# Patient Record
Sex: Male | Born: 1940 | Race: White | Hispanic: No | Marital: Married | Smoking: Never smoker
Health system: Southern US, Community
[De-identification: ages and names within clinical notes are randomized; demographics above are authoritative.]

## PROBLEM LIST (undated history)

## (undated) DIAGNOSIS — I10 Essential (primary) hypertension: Secondary | ICD-10-CM

## (undated) DIAGNOSIS — E079 Disorder of thyroid, unspecified: Secondary | ICD-10-CM

## (undated) DIAGNOSIS — E119 Type 2 diabetes mellitus without complications: Secondary | ICD-10-CM

## (undated) DIAGNOSIS — Z9889 Other specified postprocedural states: Secondary | ICD-10-CM

## (undated) DIAGNOSIS — I4891 Unspecified atrial fibrillation: Secondary | ICD-10-CM

## (undated) DIAGNOSIS — E789 Disorder of lipoprotein metabolism, unspecified: Secondary | ICD-10-CM

## (undated) HISTORY — PX: THYROIDECTOMY: SHX17

## (undated) HISTORY — PX: CHOLECYSTECTOMY: SHX55

## (undated) HISTORY — PX: KNEE SURGERY: SHX244

## (undated) HISTORY — PX: BACK SURGERY: SHX140

---

## 2000-10-05 ENCOUNTER — Encounter: Payer: Self-pay | Admitting: Orthopedic Surgery

## 2000-10-05 ENCOUNTER — Encounter: Admission: RE | Admit: 2000-10-05 | Discharge: 2000-10-05 | Payer: Self-pay | Admitting: Orthopedic Surgery

## 2005-09-25 ENCOUNTER — Encounter: Admission: RE | Admit: 2005-09-25 | Discharge: 2005-09-25 | Payer: Self-pay | Admitting: Orthopedic Surgery

## 2005-11-04 ENCOUNTER — Encounter: Admission: RE | Admit: 2005-11-04 | Discharge: 2005-11-04 | Payer: Self-pay | Admitting: Chiropractor

## 2007-08-04 ENCOUNTER — Encounter: Admission: RE | Admit: 2007-08-04 | Discharge: 2007-08-04 | Payer: Self-pay | Admitting: Obstetrics and Gynecology

## 2009-02-13 ENCOUNTER — Encounter: Admission: RE | Admit: 2009-02-13 | Discharge: 2009-02-13 | Payer: Self-pay | Admitting: Obstetrics and Gynecology

## 2009-04-06 ENCOUNTER — Inpatient Hospital Stay (HOSPITAL_COMMUNITY): Admission: RE | Admit: 2009-04-06 | Discharge: 2009-04-06 | Payer: Self-pay | Admitting: Neurosurgery

## 2009-05-11 ENCOUNTER — Encounter: Admission: RE | Admit: 2009-05-11 | Discharge: 2009-05-11 | Payer: Self-pay | Admitting: Neurosurgery

## 2009-09-30 ENCOUNTER — Emergency Department (HOSPITAL_BASED_OUTPATIENT_CLINIC_OR_DEPARTMENT_OTHER): Admission: EM | Admit: 2009-09-30 | Discharge: 2009-09-30 | Payer: Self-pay | Admitting: Emergency Medicine

## 2010-03-10 ENCOUNTER — Encounter: Payer: Self-pay | Admitting: Neurosurgery

## 2010-05-03 LAB — URINALYSIS, ROUTINE W REFLEX MICROSCOPIC
Bilirubin Urine: NEGATIVE
Glucose, UA: NEGATIVE mg/dL
Hgb urine dipstick: NEGATIVE
Protein, ur: NEGATIVE mg/dL
Urobilinogen, UA: 0.2 mg/dL (ref 0.0–1.0)

## 2010-05-09 LAB — BASIC METABOLIC PANEL
BUN: 20 mg/dL (ref 6–23)
CO2: 26 mEq/L (ref 19–32)
Calcium: 9.1 mg/dL (ref 8.4–10.5)
Glucose, Bld: 181 mg/dL — ABNORMAL HIGH (ref 70–99)
Sodium: 136 mEq/L (ref 135–145)

## 2010-05-09 LAB — GLUCOSE, CAPILLARY

## 2010-05-09 LAB — CBC
Hemoglobin: 14 g/dL (ref 13.0–17.0)
MCHC: 34.2 g/dL (ref 30.0–36.0)
MCV: 94.7 fL (ref 78.0–100.0)
RBC: 4.34 MIL/uL (ref 4.22–5.81)
RDW: 14.6 % (ref 11.5–15.5)

## 2010-05-09 LAB — DIFFERENTIAL
Basophils Absolute: 0.1 10*3/uL (ref 0.0–0.1)
Basophils Relative: 1 % (ref 0–1)
Eosinophils Absolute: 0.2 10*3/uL (ref 0.0–0.7)
Monocytes Absolute: 0.7 10*3/uL (ref 0.1–1.0)
Monocytes Relative: 12 % (ref 3–12)
Neutrophils Relative %: 58 % (ref 43–77)

## 2010-05-09 LAB — POCT I-STAT 4, (NA,K, GLUC, HGB,HCT)
Glucose, Bld: 166 mg/dL — ABNORMAL HIGH (ref 70–99)
Potassium: 5.1 mEq/L (ref 3.5–5.1)
Sodium: 145 mEq/L (ref 135–145)

## 2010-05-22 ENCOUNTER — Other Ambulatory Visit: Payer: Self-pay | Admitting: Neurosurgery

## 2010-05-22 DIAGNOSIS — M545 Low back pain, unspecified: Secondary | ICD-10-CM

## 2010-05-28 ENCOUNTER — Ambulatory Visit
Admission: RE | Admit: 2010-05-28 | Discharge: 2010-05-28 | Disposition: A | Discharge status: Home / Self Care | Payer: Medicare Other | Source: Ambulatory Visit | Attending: Neurosurgery | Admitting: Neurosurgery

## 2010-05-28 DIAGNOSIS — M545 Low back pain, unspecified: Secondary | ICD-10-CM

## 2010-07-19 ENCOUNTER — Other Ambulatory Visit: Payer: Self-pay | Admitting: *Deleted

## 2010-07-19 DIAGNOSIS — M25562 Pain in left knee: Secondary | ICD-10-CM

## 2010-07-22 ENCOUNTER — Ambulatory Visit
Admission: RE | Admit: 2010-07-22 | Discharge: 2010-07-22 | Disposition: A | Discharge status: Home / Self Care | Payer: Medicare Other | Source: Ambulatory Visit | Attending: *Deleted | Admitting: *Deleted

## 2010-07-22 DIAGNOSIS — M25562 Pain in left knee: Secondary | ICD-10-CM

## 2011-07-19 DEATH — deceased

## 2011-07-25 ENCOUNTER — Other Ambulatory Visit: Payer: Self-pay | Admitting: Neurosurgery

## 2011-07-25 DIAGNOSIS — M545 Low back pain, unspecified: Secondary | ICD-10-CM

## 2011-07-25 DIAGNOSIS — M542 Cervicalgia: Secondary | ICD-10-CM

## 2011-08-02 ENCOUNTER — Ambulatory Visit
Admission: RE | Admit: 2011-08-02 | Discharge: 2011-08-02 | Disposition: A | Discharge status: Home / Self Care | Payer: Medicare Other | Source: Ambulatory Visit | Attending: Neurosurgery | Admitting: Neurosurgery

## 2011-08-02 DIAGNOSIS — M545 Low back pain, unspecified: Secondary | ICD-10-CM

## 2011-08-02 DIAGNOSIS — M542 Cervicalgia: Secondary | ICD-10-CM

## 2011-09-18 ENCOUNTER — Other Ambulatory Visit: Payer: Self-pay | Admitting: Neurosurgery

## 2011-09-18 DIAGNOSIS — M549 Dorsalgia, unspecified: Secondary | ICD-10-CM

## 2011-09-21 ENCOUNTER — Ambulatory Visit
Admission: RE | Admit: 2011-09-21 | Discharge: 2011-09-21 | Disposition: A | Discharge status: Home / Self Care | Payer: Medicare Other | Source: Ambulatory Visit | Attending: Neurosurgery | Admitting: Neurosurgery

## 2011-09-21 DIAGNOSIS — M549 Dorsalgia, unspecified: Secondary | ICD-10-CM

## 2012-01-30 ENCOUNTER — Other Ambulatory Visit: Payer: Self-pay | Admitting: Orthopedic Surgery

## 2012-01-30 DIAGNOSIS — M25561 Pain in right knee: Secondary | ICD-10-CM

## 2012-01-30 DIAGNOSIS — M25562 Pain in left knee: Secondary | ICD-10-CM

## 2012-02-05 ENCOUNTER — Other Ambulatory Visit: Payer: Medicare Other

## 2012-02-06 ENCOUNTER — Other Ambulatory Visit: Payer: Medicare Other

## 2012-02-07 ENCOUNTER — Ambulatory Visit
Admission: RE | Admit: 2012-02-07 | Discharge: 2012-02-07 | Disposition: A | Discharge status: Home / Self Care | Payer: Medicare Other | Source: Ambulatory Visit | Attending: Orthopedic Surgery | Admitting: Orthopedic Surgery

## 2012-02-07 DIAGNOSIS — M25562 Pain in left knee: Secondary | ICD-10-CM

## 2012-02-07 DIAGNOSIS — M25561 Pain in right knee: Secondary | ICD-10-CM

## 2012-11-22 ENCOUNTER — Other Ambulatory Visit: Payer: Self-pay | Admitting: Neurosurgery

## 2012-11-22 DIAGNOSIS — M545 Low back pain, unspecified: Secondary | ICD-10-CM

## 2012-12-03 ENCOUNTER — Other Ambulatory Visit: Payer: Medicare Other

## 2013-03-18 ENCOUNTER — Other Ambulatory Visit: Payer: Self-pay | Admitting: Orthopedic Surgery

## 2013-03-18 DIAGNOSIS — M5030 Other cervical disc degeneration, unspecified cervical region: Secondary | ICD-10-CM

## 2013-03-21 ENCOUNTER — Ambulatory Visit
Admission: RE | Admit: 2013-03-21 | Discharge: 2013-03-21 | Disposition: A | Discharge status: Home / Self Care | Payer: Medicare Other | Source: Ambulatory Visit | Attending: Orthopedic Surgery | Admitting: Orthopedic Surgery

## 2013-03-21 DIAGNOSIS — M5030 Other cervical disc degeneration, unspecified cervical region: Secondary | ICD-10-CM

## 2013-05-09 ENCOUNTER — Other Ambulatory Visit: Payer: Self-pay | Admitting: Physical Medicine and Rehabilitation

## 2013-05-09 DIAGNOSIS — M545 Low back pain, unspecified: Secondary | ICD-10-CM

## 2013-05-16 ENCOUNTER — Ambulatory Visit
Admission: RE | Admit: 2013-05-16 | Discharge: 2013-05-16 | Disposition: A | Discharge status: Home / Self Care | Payer: Medicare Other | Source: Ambulatory Visit | Attending: Physical Medicine and Rehabilitation | Admitting: Physical Medicine and Rehabilitation

## 2013-05-16 DIAGNOSIS — M545 Low back pain, unspecified: Secondary | ICD-10-CM

## 2013-05-16 MED ORDER — GADOBENATE DIMEGLUMINE 529 MG/ML IV SOLN
20.0000 mL | Freq: Once | INTRAVENOUS | Status: AC | PRN
  Administered 2013-05-16: 20 mL via INTRAVENOUS

## 2013-05-25 ENCOUNTER — Other Ambulatory Visit: Payer: Self-pay | Admitting: Family Medicine

## 2013-05-25 DIAGNOSIS — N2889 Other specified disorders of kidney and ureter: Secondary | ICD-10-CM

## 2013-05-25 DIAGNOSIS — R911 Solitary pulmonary nodule: Secondary | ICD-10-CM

## 2013-06-01 ENCOUNTER — Other Ambulatory Visit: Payer: Medicare Other

## 2013-06-01 ENCOUNTER — Inpatient Hospital Stay: Admission: RE | Admit: 2013-06-01 | Payer: Medicare Other | Source: Ambulatory Visit

## 2013-06-07 ENCOUNTER — Emergency Department (HOSPITAL_BASED_OUTPATIENT_CLINIC_OR_DEPARTMENT_OTHER): Payer: Medicare Other

## 2013-06-07 ENCOUNTER — Encounter (HOSPITAL_BASED_OUTPATIENT_CLINIC_OR_DEPARTMENT_OTHER): Payer: Self-pay | Admitting: Emergency Medicine

## 2013-06-07 ENCOUNTER — Emergency Department (HOSPITAL_BASED_OUTPATIENT_CLINIC_OR_DEPARTMENT_OTHER)
Admission: EM | Admit: 2013-06-07 | Discharge: 2013-06-07 | Disposition: A | Discharge status: Home / Self Care | Payer: Medicare Other | Attending: Emergency Medicine | Admitting: Emergency Medicine

## 2013-06-07 DIAGNOSIS — S8010XA Contusion of unspecified lower leg, initial encounter: Secondary | ICD-10-CM | POA: Insufficient documentation

## 2013-06-07 DIAGNOSIS — I4891 Unspecified atrial fibrillation: Secondary | ICD-10-CM | POA: Insufficient documentation

## 2013-06-07 DIAGNOSIS — S9030XA Contusion of unspecified foot, initial encounter: Secondary | ICD-10-CM | POA: Insufficient documentation

## 2013-06-07 DIAGNOSIS — S8011XA Contusion of right lower leg, initial encounter: Secondary | ICD-10-CM

## 2013-06-07 DIAGNOSIS — Y939 Activity, unspecified: Secondary | ICD-10-CM | POA: Insufficient documentation

## 2013-06-07 DIAGNOSIS — W010XXA Fall on same level from slipping, tripping and stumbling without subsequent striking against object, initial encounter: Secondary | ICD-10-CM | POA: Insufficient documentation

## 2013-06-07 DIAGNOSIS — Y929 Unspecified place or not applicable: Secondary | ICD-10-CM | POA: Insufficient documentation

## 2013-06-07 DIAGNOSIS — Z7982 Long term (current) use of aspirin: Secondary | ICD-10-CM | POA: Insufficient documentation

## 2013-06-07 DIAGNOSIS — Z79899 Other long term (current) drug therapy: Secondary | ICD-10-CM | POA: Insufficient documentation

## 2013-06-07 HISTORY — DX: Other specified postprocedural states: Z98.890

## 2013-06-07 HISTORY — DX: Unspecified atrial fibrillation: I48.91

## 2013-06-07 NOTE — ED Notes (Addendum)
Tripped/fell 2 weeks ago at home-c/o pain to right foot/ankle-pt is wearing postop boot-steady gait into traige

## 2013-06-07 NOTE — Discharge Instructions (Signed)
Foot Contusion °A foot contusion is a deep bruise to the foot. Contusions are the result of an injury that caused bleeding under the skin. The contusion may turn blue, purple, or yellow. Minor injuries will give you a painless contusion, but more severe contusions may stay painful and swollen for a few weeks. °CAUSES  °A foot contusion comes from a direct blow to that area, such as a heavy object falling on the foot. °SYMPTOMS  °· Swelling of the foot. °· Discoloration of the foot. °· Tenderness or soreness of the foot. °DIAGNOSIS  °You will have a physical exam and will be asked about your history. You may need an X-Mairead Schwarzkopf of your foot to look for a broken bone (fracture).  °TREATMENT  °An elastic wrap may be recommended to support your foot. Resting, elevating, and applying cold compresses to your foot are often the best treatments for a foot contusion. Over-the-counter medicines may also be recommended for pain control. °HOME CARE INSTRUCTIONS  °· Put ice on the injured area. °· Put ice in a plastic bag. °· Place a towel between your skin and the bag. °· Leave the ice on for 15-20 minutes, 03-04 times a day. °· Only take over-the-counter or prescription medicines for pain, discomfort, or fever as directed by your caregiver. °· If told, use an elastic wrap as directed. This can help reduce swelling. You may remove the wrap for sleeping, showering, and bathing. If your toes become numb, cold, or blue, take the wrap off and reapply it more loosely. °· Elevate your foot with pillows to reduce swelling. °· Try to avoid standing or walking while the foot is painful. Do not resume use until instructed by your caregiver. Then, begin use gradually. If pain develops, decrease use. Gradually increase activities that do not cause discomfort until you have normal use of your foot. °· See your caregiver as directed. It is very important to keep all follow-up appointments in order to avoid any lasting problems with your foot,  including long-term (chronic) pain. °SEEK IMMEDIATE MEDICAL CARE IF:  °· You have increased redness, swelling, or pain in your foot. °· Your swelling or pain is not relieved with medicines. °· You have loss of feeling in your foot or are unable to move your toes. °· Your foot turns cold or blue. °· You have pain when you move your toes. °· Your foot becomes warm to the touch. °· Your contusion does not improve in 2 days. °MAKE SURE YOU:  °· Understand these instructions. °· Will watch your condition. °· Will get help right away if you are not doing well or get worse. °Document Released: 11/25/2005 Document Revised: 08/05/2011 Document Reviewed: 01/07/2011 °[employer]® Patient Information ©2014 [employer]. ° °

## 2013-06-07 NOTE — ED Provider Notes (Signed)
CSN: [id number]     Arrival date & time 06/07/13  1443 History   First MD Initiated Contact with Patient 06/07/13 1516     Chief Complaint  Patient presents with  . Foot Injury     (Consider location/radiation/quality/duration/timing/severity/associated sxs/prior Treatment) Patient is a 73 y.o. male presenting with foot injury. The history is provided by the patient.  Foot Injury Location:  Foot and leg Time since incident:  2 weeks Injury: yes   Mechanism of injury: fall   Mechanism of injury comment:  Patient tripped striking right leg against chair and fell injuring right foot.  Fall:    Fall occurred:  Standing   Impact surface:  Hard floor   Point of impact:  Knees   Entrapped after fall: no   Leg location:  R leg Foot location:  R foot Pain details:    Quality:  Aching   Severity:  Moderate   Onset quality:  Sudden   Timing:  Constant Chronicity:  New Dislocation: no   Foreign body present:  No foreign bodies Relieved by:  Nothing   Past Medical History  Diagnosis Date  . A-fib   . History of cardiac radiofrequency ablation    Past Surgical History  Procedure Laterality Date  . Back surgery    . Thyroidectomy    . Cholecystectomy     No family history on file. History  Substance Use Topics  . Smoking status: Never Smoker   . Smokeless tobacco: Not on file  . Alcohol Use: No    Review of Systems    Allergies  Review of patient's allergies indicates no known allergies.  Home Medications   Prior to Admission medications   Medication Sig Start Date End Date Taking? Authorizing Provider  aspirin 325 MG tablet Take 325 mg by mouth daily.   Yes Historical Provider, MD  atorvastatin (LIPITOR) 20 MG tablet Take 20 mg by mouth daily.   Yes Historical Provider, MD  FLECAINIDE ACETATE PO Take by mouth.   Yes Historical Provider, MD  GLIMEPIRIDE PO Take by mouth.   Yes Historical Provider, MD  Levothyroxine Sodium (SYNTHROID PO) Take by mouth.   Yes  Historical Provider, MD  LOSARTAN POTASSIUM PO Take by mouth.   Yes Historical Provider, MD  METFORMIN HCL PO Take by mouth.   Yes Historical Provider, MD  OMEPRAZOLE PO Take by mouth.   Yes Historical Provider, MD  tamsulosin (FLOMAX) 0.4 MG CAPS capsule Take 0.4 mg by mouth.   Yes Historical Provider, MD  TRIAMTERENE PO Take by mouth.   Yes Historical Provider, MD  UNKNOWN TO PATIENT "insulin pen"-15 units daily-pt unsure of insulin name   Yes Historical Provider, MD   BP 149/89  Pulse 104  Temp(Src) 97.7 F (36.5 C) (Oral)  Resp 18  Ht 6\' 1"  (1.854 m)  Wt 300 lb (136.079 kg)  BMI 39.59 kg/m2  SpO2 100% Physical Exam  Nursing note and vitals reviewed. Constitutional: He is oriented to person, place, and time. He appears well-developed and well-nourished.  Morbidly obese  HENT:  Head: Normocephalic and atraumatic.  Eyes: Conjunctivae are normal. Pupils are equal, round, and reactive to light.  Neck: Normal range of motion.  Pulmonary/Chest: Effort normal.  Abdominal: Soft.  Musculoskeletal: Normal range of motion. He exhibits edema and tenderness.       Legs:      Feet:  Swelling   Neurological: He is alert and oriented to person, place, and time. He has normal reflexes.  Skin: Skin is warm and dry.  Psychiatric: He has a normal mood and affect. His behavior is normal. Judgment and thought content normal.    ED Course  Procedures (including critical care time) Labs Review Labs Reviewed - No data to display  Imaging Review No results found.   EKG Interpretation None      MDM   Final diagnoses:  None    Patient with no evidence of fracture.  Plan op referral to ortho for follow up.     Hilario Quarryanielle S Tramaine Sauls, MD 06/07/13 [phone]

## 2013-06-08 ENCOUNTER — Other Ambulatory Visit: Payer: Medicare Other

## 2014-06-15 ENCOUNTER — Other Ambulatory Visit: Payer: Self-pay | Admitting: Family Medicine

## 2014-06-16 ENCOUNTER — Other Ambulatory Visit: Payer: Self-pay | Admitting: Obstetrics and Gynecology

## 2014-06-16 DIAGNOSIS — R1031 Right lower quadrant pain: Secondary | ICD-10-CM

## 2014-07-06 ENCOUNTER — Ambulatory Visit
Admission: RE | Admit: 2014-07-06 | Discharge: 2014-07-06 | Disposition: A | Discharge status: Home / Self Care | Payer: Medicare Other | Source: Ambulatory Visit | Attending: Obstetrics and Gynecology | Admitting: Obstetrics and Gynecology

## 2014-07-06 DIAGNOSIS — R1031 Right lower quadrant pain: Secondary | ICD-10-CM

## 2014-09-01 ENCOUNTER — Emergency Department (HOSPITAL_BASED_OUTPATIENT_CLINIC_OR_DEPARTMENT_OTHER): Payer: Medicare Other

## 2014-09-01 ENCOUNTER — Emergency Department (HOSPITAL_BASED_OUTPATIENT_CLINIC_OR_DEPARTMENT_OTHER)
Admission: EM | Admit: 2014-09-01 | Discharge: 2014-09-01 | Disposition: A | Discharge status: Home / Self Care | Payer: Medicare Other | Attending: Emergency Medicine | Admitting: Emergency Medicine

## 2014-09-01 ENCOUNTER — Encounter (HOSPITAL_BASED_OUTPATIENT_CLINIC_OR_DEPARTMENT_OTHER): Payer: Self-pay | Admitting: *Deleted

## 2014-09-01 DIAGNOSIS — I4891 Unspecified atrial fibrillation: Secondary | ICD-10-CM | POA: Diagnosis not present

## 2014-09-01 DIAGNOSIS — Z7982 Long term (current) use of aspirin: Secondary | ICD-10-CM | POA: Insufficient documentation

## 2014-09-01 DIAGNOSIS — Z9889 Other specified postprocedural states: Secondary | ICD-10-CM | POA: Diagnosis not present

## 2014-09-01 DIAGNOSIS — M549 Dorsalgia, unspecified: Secondary | ICD-10-CM | POA: Diagnosis present

## 2014-09-01 DIAGNOSIS — R079 Chest pain, unspecified: Secondary | ICD-10-CM | POA: Diagnosis not present

## 2014-09-01 DIAGNOSIS — Z794 Long term (current) use of insulin: Secondary | ICD-10-CM | POA: Insufficient documentation

## 2014-09-01 NOTE — ED Notes (Signed)
Patient transported to X-ray 

## 2014-09-01 NOTE — Discharge Instructions (Signed)

## 2014-09-01 NOTE — ED Notes (Signed)
For approx 1 month, having pain at rt upper back pain with radiation to rt chest. States has been seen by PCP

## 2014-09-01 NOTE — ED Provider Notes (Signed)
CSN: [id number]     Arrival date & time 09/01/14  1327 History   First MD Initiated Contact with Patient 09/01/14 1343     Chief Complaint  Patient presents with  . Back Pain     (Consider location/radiation/quality/duration/timing/severity/associated sxs/prior Treatment) Patient is a 74 y.o. male presenting with back pain.  Back Pain Pain location: upper right back pain. Quality:  Stabbing and aching Radiates to: right chest. Pain severity now: currently 6/10, has been 10 prior. Duration:  6 weeks Timing:  Constant Relieved by: hydrocodone, heat, massage. Worsened by:  Movement (moving arm) Ineffective treatments: not exertional. Associated symptoms: chest pain   Associated symptoms: no abdominal pain, no bladder incontinence, no bowel incontinence, no fever, no headaches, no numbness, no tingling and no weakness   Risk factors: no hx of cancer (hashimoto's thyroiditis)     Past Medical History  Diagnosis Date  . A-fib   . History of cardiac radiofrequency ablation    Past Surgical History  Procedure Laterality Date  . Back surgery    . Thyroidectomy    . Cholecystectomy     No family history on file. History  Substance Use Topics  . Smoking status: Never Smoker   . Smokeless tobacco: Not on file  . Alcohol Use: No    Review of Systems  Constitutional: Negative for fever.  HENT: Negative for sore throat.   Eyes: Negative for visual disturbance.  Respiratory: Negative for shortness of breath.   Cardiovascular: Positive for chest pain.  Gastrointestinal: Negative for nausea, vomiting, abdominal pain and bowel incontinence.  Genitourinary: Negative for bladder incontinence and difficulty urinating.  Musculoskeletal: Positive for back pain. Negative for neck stiffness.  Skin: Negative for rash.  Neurological: Negative for tingling, syncope, weakness, numbness and headaches.      Allergies  Review of patient's allergies indicates no known allergies.  Home  Medications   Prior to Admission medications   Medication Sig Start Date End Date Taking? Authorizing Provider  Apixaban (ELIQUIS PO) Take by mouth.   Yes Historical Provider, MD  aspirin 325 MG tablet Take 325 mg by mouth daily.    Historical Provider, MD  atorvastatin (LIPITOR) 20 MG tablet Take 20 mg by mouth daily.    Historical Provider, MD  FLECAINIDE ACETATE PO Take by mouth.    Historical Provider, MD  GLIMEPIRIDE PO Take by mouth.    Historical Provider, MD  Levothyroxine Sodium (SYNTHROID PO) Take by mouth.    Historical Provider, MD  LOSARTAN POTASSIUM PO Take by mouth.    Historical Provider, MD  METFORMIN HCL PO Take by mouth.    Historical Provider, MD  OMEPRAZOLE PO Take by mouth.    Historical Provider, MD  tamsulosin (FLOMAX) 0.4 MG CAPS capsule Take 0.4 mg by mouth.    Historical Provider, MD  TRIAMTERENE PO Take by mouth.    Historical Provider, MD  UNKNOWN TO PATIENT "insulin pen"-15 units daily-pt unsure of insulin name    Historical Provider, MD   BP 125/77 mmHg  Pulse 94  Temp(Src) 97.8 F (36.6 C) (Oral)  Resp 18  Ht  (1.854 m)  Wt 300 lb (136.079 kg)  BMI 39.59 kg/m2  SpO2 95% Physical Exam  Constitutional: He is oriented to person, place, and time. He appears well-developed and well-nourished. No distress.  HENT:  Head: Normocephalic and atraumatic.  Eyes: Conjunctivae and EOM are normal.  Neck: Normal range of motion.  Cardiovascular: Normal rate, regular rhythm, normal heart sounds and intact  distal pulses.  Exam reveals no gallop and no friction rub.   No murmur heard. Pulmonary/Chest: Effort normal and breath sounds normal. No respiratory distress. He has no wheezes. He has no rales. He exhibits tenderness (right chest).  Abdominal: Soft. He exhibits no distension. There is no tenderness. There is no guarding.  Musculoskeletal: He exhibits tenderness (right back). He exhibits no edema.  Neurological: He is alert and oriented to person, place,  and time.  Skin: Skin is warm and dry. He is not diaphoretic.  Nursing note and vitals reviewed.   ED Course  Procedures (including critical care time) Labs Review Labs Reviewed - No data to display  Imaging Review Dg Chest 2 View  09/01/2014   CLINICAL DATA:  Right-sided chest pain for 6 weeks.  EXAM: CHEST  2 VIEW  COMPARISON:  04/02/2009  FINDINGS: The heart size and mediastinal contours are within normal limits. Both lungs are clear. No evidence of pneumothorax or pleural effusion. Thoracic spine degenerative changes again noted.  IMPRESSION: No active cardiopulmonary disease.   Electronically Signed   By: Myles RosenthalJohn  Stahl M.D.   On: 09/01/2014 14:43     EKG Interpretation None      ED ECG REPORT   Date: 09/01/2014  Rate: 104  Rhythm: indeterminate, possible junctional rhythm  QRS Axis: normal  Intervals: normal  ST/T Wave abnormalities: normal  Conduction Disutrbances:left anterior fascicular block  Narrative Interpretation:   Old EKG Reviewed: changes noted, unclear p waves on current EKG, possible junctional rhythm  I have personally reviewed the EKG tracing and agree with the computerized printout as noted.   MDM   Final diagnoses:  Upper back pain on right side, likely musculoskeletal on exam   74yo male with history of atrial fibrillation on eliquis presents with concern of right sided back pain radiating to the right chest for 6 wks.  Patient has seen PCP for same and has had some improvement with hydrocodone.  History of pain worse with arm movements and palpation for 6wk most consistent with muscular strain.  Screening EKG done with unclear pw, however regular rhythm, no significant change in QRS/ST changes. Doubt ACS, PE, dissection given hx and exam.  No dyspnea, asymmetric leg swelling, hx of PE/DVT.  CXR done and radiology report and images reviewed by me showing no acute abnormalities. Patient has a normal neurologic exam and denies any urinary retention or overflow  incontinence, stool incontinence, saddle anesthesia, fever, IV drug use, trauma, chronic steroid use or immunocompromise and have low suspicion suspicion for cauda equina, fracture, epidural abscess, or vertebral osteomyelitis.   Discussed likely musculoskeletal pain, however given duration of symptoms and family's concern for other etiologies encouraged close follow up with PCP. Patient discharged in stable condition with understanding of reasons to return.     Alvira MondayErin Shaunee Mulkern, MD 09/02/14 1155

## 2014-09-01 NOTE — ED Notes (Signed)
Pain in his right scapula for over a month. Feels muscular. His MD has given him Vicodin for the pain with temporary relief but no cause for the pain.

## 2014-09-11 ENCOUNTER — Ambulatory Visit
Admission: RE | Admit: 2014-09-11 | Discharge: 2014-09-11 | Disposition: A | Discharge status: Home / Self Care | Payer: Medicare Other | Source: Ambulatory Visit | Attending: Family Medicine | Admitting: Family Medicine

## 2014-09-11 ENCOUNTER — Other Ambulatory Visit: Payer: Self-pay | Admitting: Family Medicine

## 2014-09-11 DIAGNOSIS — M47812 Spondylosis without myelopathy or radiculopathy, cervical region: Secondary | ICD-10-CM

## 2015-09-12 ENCOUNTER — Ambulatory Visit (HOSPITAL_COMMUNITY)
Admission: EM | Admit: 2015-09-12 | Discharge: 2015-09-12 | Disposition: A | Discharge status: Home / Self Care | Payer: Medicare Other

## 2018-10-17 ENCOUNTER — Emergency Department (HOSPITAL_BASED_OUTPATIENT_CLINIC_OR_DEPARTMENT_OTHER)
Admission: EM | Admit: 2018-10-17 | Discharge: 2018-10-17 | Disposition: A | Discharge status: Home / Self Care | Payer: Medicare Other | Attending: Emergency Medicine | Admitting: Emergency Medicine

## 2018-10-17 ENCOUNTER — Other Ambulatory Visit: Payer: Self-pay

## 2018-10-17 ENCOUNTER — Encounter (HOSPITAL_BASED_OUTPATIENT_CLINIC_OR_DEPARTMENT_OTHER): Payer: Self-pay | Admitting: Emergency Medicine

## 2018-10-17 ENCOUNTER — Emergency Department (HOSPITAL_BASED_OUTPATIENT_CLINIC_OR_DEPARTMENT_OTHER): Payer: Medicare Other

## 2018-10-17 DIAGNOSIS — S8992XA Unspecified injury of left lower leg, initial encounter: Secondary | ICD-10-CM | POA: Diagnosis present

## 2018-10-17 DIAGNOSIS — Y929 Unspecified place or not applicable: Secondary | ICD-10-CM | POA: Diagnosis not present

## 2018-10-17 DIAGNOSIS — S8002XA Contusion of left knee, initial encounter: Secondary | ICD-10-CM | POA: Diagnosis not present

## 2018-10-17 DIAGNOSIS — Y999 Unspecified external cause status: Secondary | ICD-10-CM | POA: Diagnosis not present

## 2018-10-17 DIAGNOSIS — Z79899 Other long term (current) drug therapy: Secondary | ICD-10-CM | POA: Diagnosis not present

## 2018-10-17 DIAGNOSIS — Z96652 Presence of left artificial knee joint: Secondary | ICD-10-CM | POA: Diagnosis not present

## 2018-10-17 DIAGNOSIS — Y9301 Activity, walking, marching and hiking: Secondary | ICD-10-CM | POA: Insufficient documentation

## 2018-10-17 DIAGNOSIS — Z794 Long term (current) use of insulin: Secondary | ICD-10-CM | POA: Diagnosis not present

## 2018-10-17 DIAGNOSIS — E119 Type 2 diabetes mellitus without complications: Secondary | ICD-10-CM | POA: Diagnosis not present

## 2018-10-17 DIAGNOSIS — W010XXA Fall on same level from slipping, tripping and stumbling without subsequent striking against object, initial encounter: Secondary | ICD-10-CM | POA: Insufficient documentation

## 2018-10-17 DIAGNOSIS — I10 Essential (primary) hypertension: Secondary | ICD-10-CM | POA: Diagnosis not present

## 2018-10-17 DIAGNOSIS — Z7901 Long term (current) use of anticoagulants: Secondary | ICD-10-CM | POA: Diagnosis not present

## 2018-10-17 HISTORY — DX: Disorder of lipoprotein metabolism, unspecified: E78.9

## 2018-10-17 HISTORY — DX: Type 2 diabetes mellitus without complications: E11.9

## 2018-10-17 HISTORY — DX: Essential (primary) hypertension: I10

## 2018-10-17 HISTORY — DX: Disorder of thyroid, unspecified: E07.9

## 2018-10-17 NOTE — ED Provider Notes (Signed)
[facility] EMERGENCY DEPARTMENT Provider Note   CSN: [id number] Arrival date & time: 10/17/18  1400     History   Chief Complaint Chief Complaint  Patient presents with  . Fall  . Tachycardia    HPI Rodney Murray is a 78 y.o. male.     Patient is a 78 year old male with a history of diabetes, hypertension, paroxysmal atrial fibrillation on Eliquis who presents today due to persistent swelling of his left knee.  Patient states that 1 week ago he was walking and tripped over his cane and landed directly on his left knee.  It immediately swelled and became painful.  He has been elevating the leg, icing it and states that in general the pain and swelling are improving but there is still significant swelling over the left knee.  He has been able to ambulate with minimal difficulty and denies any drainage from the area.  He denies hitting his head or loss of consciousness.  He states he otherwise feels fine.  Nurse did note that upon arrival his heart rate was in the 140s however when they rechecked it was 60.  He states he does intermittently go in and out of atrial fibrillation but denies any chest pain or shortness of breath or palpitations at this time.  The history is provided by the patient.  Fall    Past Medical History:  Diagnosis Date  . A-fib (HCC)   . Abnormal serum cholesterol   . Diabetes mellitus without complication (HCC)   . History of cardiac radiofrequency ablation   . Hypertension   . Thyroid disease     There are no active problems to display for this patient.   Past Surgical History:  Procedure Laterality Date  . BACK SURGERY    . CHOLECYSTECTOMY    . THYROIDECTOMY          Home Medications    Prior to Admission medications   Medication Sig Start Date End Date Taking? Authorizing Provider  Apixaban (ELIQUIS PO) Take by mouth.   Yes [provider]  atorvastatin (LIPITOR) 20 MG tablet Take 20 mg by mouth daily.   Yes [provider]  dronedarone (MULTAQ) 400 MG tablet Take 400 mg by mouth 2 (two) times daily with a meal.   Yes [provider]  GLIMEPIRIDE PO Take by mouth.   Yes [provider]  Insulin Detemir (LEVEMIR FLEXTOUCH) 100 UNIT/ML Pen Inject 36 Units into the skin at bedtime.  01/07/13  Yes [provider]  Levothyroxine Sodium (SYNTHROID PO) Take by mouth.   Yes [provider]  LOSARTAN POTASSIUM PO Take by mouth.   Yes [provider]  METFORMIN HCL PO Take by mouth.   Yes [provider]  tamsulosin (FLOMAX) 0.4 MG CAPS capsule Take 0.4 mg by mouth.   Yes [provider]  TRIAMTERENE PO Take by mouth.   Yes [provider]  UNKNOWN TO PATIENT "insulin pen"-15 units daily-pt unsure of insulin name    [provider]    Family History No family history on file.  Social History Social History   Tobacco Use  . Smoking status: Never Smoker  . Smokeless tobacco: Never Used  Substance Use Topics  . Alcohol use: No  . Drug use: No     Allergies   Patient has no known allergies.   Review of Systems Review of Systems  All other systems reviewed and are negative.    Physical Exam  Updated Vital Signs BP (!) 155/81 (BP Location: Right Arm)   Pulse 62   Temp 97.9 F (36.6 C) (Oral)   Resp 20   Ht 6' (1.829 m)   Wt 136.1 kg   SpO2 100%   BMI 40.69 kg/m   Physical Exam Vitals signs and nursing note reviewed.  Constitutional:      General: He is not in acute distress.    Appearance: Normal appearance. He is obese.  HENT:     Head: Normocephalic and atraumatic.     Mouth/Throat:     Mouth: Mucous membranes are moist.  Cardiovascular:     Rate and Rhythm: Normal rate and regular rhythm.     Pulses: Normal pulses.  Pulmonary:     Effort: Pulmonary effort is normal.  Musculoskeletal:        General: Tenderness present.     Left knee: He exhibits swelling and ecchymosis. He exhibits normal  range of motion, no effusion, no deformity and no laceration.     Left ankle: Normal.       Legs:  Skin:    General: Skin is warm and dry.  Neurological:     General: No focal deficit present.     Mental Status: He is alert and oriented to person, place, and time. Mental status is at baseline.  Psychiatric:        Mood and Affect: Mood normal.        Behavior: Behavior normal.        Thought Content: Thought content normal.      ED Treatments / Results  Labs (all labs ordered are listed, but only abnormal results are displayed) Labs Reviewed - No data to display  EKG EKG Interpretation  Date/Time:  Sunday October 17 2018 14:31:51 EDT Ventricular Rate:  61 PR Interval:    QRS Duration: 106 QT Interval:  422 QTC Calculation: 424 R Axis:   -61 Text Interpretation:  Sinus rhythm with marked sinus arrhythmia with 1st degree A-V block Pulmonary disease pattern Left anterior fascicular block Confirmed by Gwyneth SproutPlunkett, Kolter Reaver ([id number]) on 10/17/2018 4:37:13 PM   Radiology Dg Knee Complete 4 Views Left  Result Date: 10/17/2018 CLINICAL DATA:  Pain after fall. EXAM: LEFT KNEE - COMPLETE 4+ VIEW COMPARISON:  None. FINDINGS: The patient is status post left knee replacement. There is soft tissue swelling in the anterior knee, just below the level the patella. Vascular calcifications are noted. No fractures are identified. IMPRESSION: Soft tissue swelling. No fracture. No acute abnormality otherwise seen. Electronically Signed   By: Gerome Samavid  Williams III M.D   On: 10/17/2018 15:15    Procedures Procedures (including critical care time)  Medications Ordered in ED Medications - No data to display   Initial Impression / Assessment and Plan / ED Course  I have reviewed the triage vital signs and the nursing notes.  Pertinent labs & imaging results that were available during my care of the patient were reviewed by me and considered in my medical decision making (see chart for details).         Elderly male presenting after a fall when he tripped over his cane a week ago with persistent swelling in the left knee.  Patient's x-ray is negative and no sign of acute abnormality of his hardware.  However patient does have a significant hematoma over the area.  He also is on Eliquis.  This is most likely the cause of this.  He has full range of motion of his  knee and he is able to ambulate without difficulty.  He denies any head injury or loss of consciousness.  Encouraged the patient to wear a knee sleeve and to continue to elevate when he can.  Cautioned if his incision opens up or starts draining he needs to see his orthopedist.  Patient initially may have been in atrial fibrillation upon arrival to the emergency room but with EKG and repeat checks heart rate has been regular and in the 60s and 70s.  He takes Eliquis and this is not of concern at this time.  Final Clinical Impressions(s) / ED Diagnoses   Final diagnoses:  Traumatic hematoma of left knee, initial encounter    ED Discharge Orders    None       Blanchie Dessert, MD 10/17/18 2006

## 2018-10-17 NOTE — ED Triage Notes (Addendum)
He fell 1 week ago. C/o L knee and L foot pain. Swelling noted. During triage, pt noted to be tachycardic.

## 2018-10-17 NOTE — ED Notes (Signed)
Pt HR now WNL. EDP made aware. Pt denies dizziness, SOB. He states he has tachycardia from time to time due to Afib

## 2020-08-06 ENCOUNTER — Emergency Department (HOSPITAL_BASED_OUTPATIENT_CLINIC_OR_DEPARTMENT_OTHER)
Admission: EM | Admit: 2020-08-06 | Discharge: 2020-08-06 | Disposition: A | Discharge status: Home / Self Care | Payer: Medicare Other | Attending: Emergency Medicine | Admitting: Emergency Medicine

## 2020-08-06 ENCOUNTER — Other Ambulatory Visit: Payer: Self-pay

## 2020-08-06 ENCOUNTER — Encounter (HOSPITAL_BASED_OUTPATIENT_CLINIC_OR_DEPARTMENT_OTHER): Payer: Self-pay

## 2020-08-06 ENCOUNTER — Emergency Department (HOSPITAL_BASED_OUTPATIENT_CLINIC_OR_DEPARTMENT_OTHER): Payer: Medicare Other

## 2020-08-06 DIAGNOSIS — E119 Type 2 diabetes mellitus without complications: Secondary | ICD-10-CM | POA: Diagnosis not present

## 2020-08-06 DIAGNOSIS — Z79899 Other long term (current) drug therapy: Secondary | ICD-10-CM | POA: Insufficient documentation

## 2020-08-06 DIAGNOSIS — Z7901 Long term (current) use of anticoagulants: Secondary | ICD-10-CM | POA: Insufficient documentation

## 2020-08-06 DIAGNOSIS — R1031 Right lower quadrant pain: Secondary | ICD-10-CM

## 2020-08-06 DIAGNOSIS — Z7984 Long term (current) use of oral hypoglycemic drugs: Secondary | ICD-10-CM | POA: Insufficient documentation

## 2020-08-06 DIAGNOSIS — R1030 Lower abdominal pain, unspecified: Secondary | ICD-10-CM | POA: Diagnosis not present

## 2020-08-06 DIAGNOSIS — I10 Essential (primary) hypertension: Secondary | ICD-10-CM | POA: Diagnosis not present

## 2020-08-06 DIAGNOSIS — M25551 Pain in right hip: Secondary | ICD-10-CM | POA: Diagnosis present

## 2020-08-06 DIAGNOSIS — Z794 Long term (current) use of insulin: Secondary | ICD-10-CM | POA: Diagnosis not present

## 2020-08-06 DIAGNOSIS — I4891 Unspecified atrial fibrillation: Secondary | ICD-10-CM | POA: Diagnosis not present

## 2020-08-06 MED ORDER — CYCLOBENZAPRINE HCL 10 MG PO TABS: 10.0000 mg | ORAL_TABLET | Freq: Two times a day (BID) | ORAL | 0 refills | Status: AC | PRN

## 2020-08-06 NOTE — ED Provider Notes (Signed)
[facility] EMERGENCY DEPARTMENT Provider Note   CSN: [id number] Arrival date & time: 08/06/20  1322     History Chief Complaint  Patient presents with   Multiple c/o    Rodney Murray is a 80 y.o. male.  HPI     80 year old male with a history of diabetes, hypertension, hyperlipidemia, atrial fibrillation, thyroid disease presents with concern for right groin and hip pain.   For about 3 weeks has had right groin pain Now pain radiating towards right hip and right back  Painful to sit on it, pain in buttock. Not as much in groin. Started in groin and constant Had fusion 51yrs ago, worried something might be wrong with fusion, worse with turning in bed  Worse with lifting right leg More groin pain than testicular pain Neuropathy and chronic numbness there but no new numbness, no weakness but pain with lifting Able to walk but painful No loss of control of bowel or bladder, fevers, IVDU No dysuria, no abdominal pain (has some occ discomfort chronic) No falls or trauma   Past Medical History:  Diagnosis Date   A-fib (HCC)    Abnormal serum cholesterol    Diabetes mellitus without complication (HCC)    History of cardiac radiofrequency ablation    Hypertension    Thyroid disease     There are no problems to display for this patient.   Past Surgical History:  Procedure Laterality Date   BACK SURGERY     CHOLECYSTECTOMY     KNEE SURGERY     THYROIDECTOMY         No family history on file.  Social History   Tobacco Use   Smoking status: Never   Smokeless tobacco: Never  Substance Use Topics   Alcohol use: No   Drug use: No    Home Medications Prior to Admission medications   Medication Sig Start Date End Date Taking? Authorizing Provider  cyclobenzaprine (FLEXERIL) 10 MG tablet Take 1 tablet (10 mg total) by mouth 2 (two) times daily as needed for muscle spasms. 08/06/20  Yes Alvira Monday, MD  Apixaban (ELIQUIS PO) Take by mouth.     [provider]  atorvastatin (LIPITOR) 20 MG tablet Take 20 mg by mouth daily.    [provider]  dronedarone (MULTAQ) 400 MG tablet Take 400 mg by mouth 2 (two) times daily with a meal.    [provider]  GLIMEPIRIDE PO Take by mouth.    [provider]  Insulin Detemir (LEVEMIR FLEXTOUCH) 100 UNIT/ML Pen Inject 36 Units into the skin at bedtime.  01/07/13   [provider]  Levothyroxine Sodium (SYNTHROID PO) Take by mouth.    [provider]  LOSARTAN POTASSIUM PO Take by mouth.    [provider]  METFORMIN HCL PO Take by mouth.    [provider]  tamsulosin (FLOMAX) 0.4 MG CAPS capsule Take 0.4 mg by mouth.    [provider]  TRIAMTERENE PO Take by mouth.    [provider]  UNKNOWN TO PATIENT "insulin pen"-15 units daily-pt unsure of insulin name    [provider]    Allergies    Patient has no known allergies.  Review of Systems   Review of Systems  Constitutional:  Negative for fever.  Eyes:  Negative for visual disturbance.  Respiratory:  Negative for shortness of breath.   Cardiovascular:  Negative for chest pain.  Gastrointestinal:  Negative for abdominal pain, nausea and vomiting.  Genitourinary:  Negative for difficulty urinating.  Musculoskeletal:  Positive for arthralgias and back pain. Negative for neck stiffness.  Skin:  Negative for rash.  Neurological:  Negative for syncope, weakness, numbness (neuropathy no new) and headaches.   Physical Exam Updated Vital Signs BP (!) 158/71 (BP Location: Left Arm)   Pulse 68   Temp 98.3 F (36.8 C) (Oral)   Resp 18   Ht 6' (1.829 m)   Wt (!) 152 kg   SpO2 99%   BMI 45.43 kg/m   Physical Exam Vitals and nursing note reviewed.  Constitutional:      General: He is not in acute distress.    Appearance: Normal appearance. He is not ill-appearing, toxic-appearing or diaphoretic.  HENT:     Head: Normocephalic.  Eyes:      Conjunctiva/sclera: Conjunctivae normal.  Cardiovascular:     Rate and Rhythm: Normal rate and regular rhythm.     Pulses: Normal pulses.  Pulmonary:     Effort: Pulmonary effort is normal. No respiratory distress.  Abdominal:     General: Abdomen is flat. There is no distension.     Tenderness: There is no abdominal tenderness.  Musculoskeletal:        General: Tenderness (right hip, pain with rotation hip) present. No deformity or signs of injury.     Cervical back: No rigidity.  Skin:    General: Skin is warm and dry.     Coloration: Skin is not jaundiced or pale.  Neurological:     General: No focal deficit present.     Mental Status: He is alert and oriented to person, place, and time.     Comments: Normal bilateral LE strength (chronic neuropathy, no acute numbness)        ED Results / Procedures / Treatments   Labs (all labs ordered are listed, but only abnormal results are displayed) Labs Reviewed - No data to display  EKG None  Radiology DG Hip Unilat W or Wo Pelvis 2-3 Views Right  Result Date: 08/06/2020 CLINICAL DATA:  Right hip pain EXAM: DG HIP (WITH OR WITHOUT PELVIS) 2-3V RIGHT COMPARISON:  03/21/2017 FINDINGS: Orthopedic hardware in the lumbosacral spine. SI joints are non widened. Pubic symphysis and rami are intact. U shaped metallic clip in the pelvis. No fracture or malalignment. IMPRESSION: Negative. Electronically Signed   By: Jasmine Pang M.D.   On: 08/06/2020 16:33    Procedures Procedures   Medications Ordered in ED Medications - No data to display  ED Course  I have reviewed the triage vital signs and the nursing notes.  Pertinent labs & imaging results that were available during my care of the patient were reviewed by me and considered in my medical decision making (see chart for details).    MDM Rules/Calculators/A&P                           80 year old male with a history of diabetes, hypertension, hyperlipidemia, atrial  fibrillation, thyroid disease presents with concern for right groin and hip pain. XR shows no sign of fracture or other abnormality.  Abdominal exam benign, normal pulses bilaterally, no sign of hernia on exam, no n/v, tolerating po, passing gas, no urinary symptoms, no testicular tenderness and have low suspicion for ruptured AAA, diverticulitis, appendicitis, ileopsoas abscess, incarcerated or strangulated hernia, testicular torsion or epididymitis, septic arthritis.    Appears to be localizing to hip with some painful ROM, also consider other  radicular pain, piriformis syndrome.  Recommend Orthopedic follow up. Patient discharged in stable condition with understanding of reasons to return.    Final Clinical Impression(s) / ED Diagnoses Final diagnoses:  Right groin pain  Right hip pain    Rx / DC Orders ED Discharge Orders          Ordered    cyclobenzaprine (FLEXERIL) 10 MG tablet  2 times daily PRN        08/06/20 1712             Alvira Monday, MD 08/07/20 1342

## 2020-08-06 NOTE — Discharge Instructions (Addendum)
I suspect your joint pain is caused by a hip joint problem (consider femoraoacetabular impingement syndrome etc), a radicular pain from your lumbar spine or possible sacroiliac disorder. It is less likely to be an occult fracture without trauma, however I recommend follow up your orthopedic physician regarding your pain for further evaluation.  You may continue activity as tolerated.  I do not see signs of emergent pathology such as a strangulated or incarcerated hernia, ruptured aneurysm, clots in the artery, infection of the joint, testicular emergency or surgical emergency of your abdomen.

## 2020-08-06 NOTE — ED Notes (Signed)
Unable to obtain vitals prior to d/c. Pt left room to use restroom and was standing in hallway to obtain paperwork. No further questions at time of discharge. Paperwork reviewed and medications discussed. NAD, ambulatory to check out desk with cane

## 2020-08-06 NOTE — ED Triage Notes (Signed)
Pt c/o pain to right groin x 2-3 weeks-reports pain now has progressed to right hip/buttock and right LE x 1 week-denies injury to all pain sites-pt NAD-walking with own cane

## 2021-12-22 IMAGING — DX DG HIP (WITH OR WITHOUT PELVIS) 2-3V*R*
3 series · 3 of 3 positions shown · non-contrast
Comparison: 03/21/2017

CLINICAL DATA: Right hip pain

EXAM:
DG HIP (WITH OR WITHOUT PELVIS) 2-3V RIGHT

[pelvis ap]
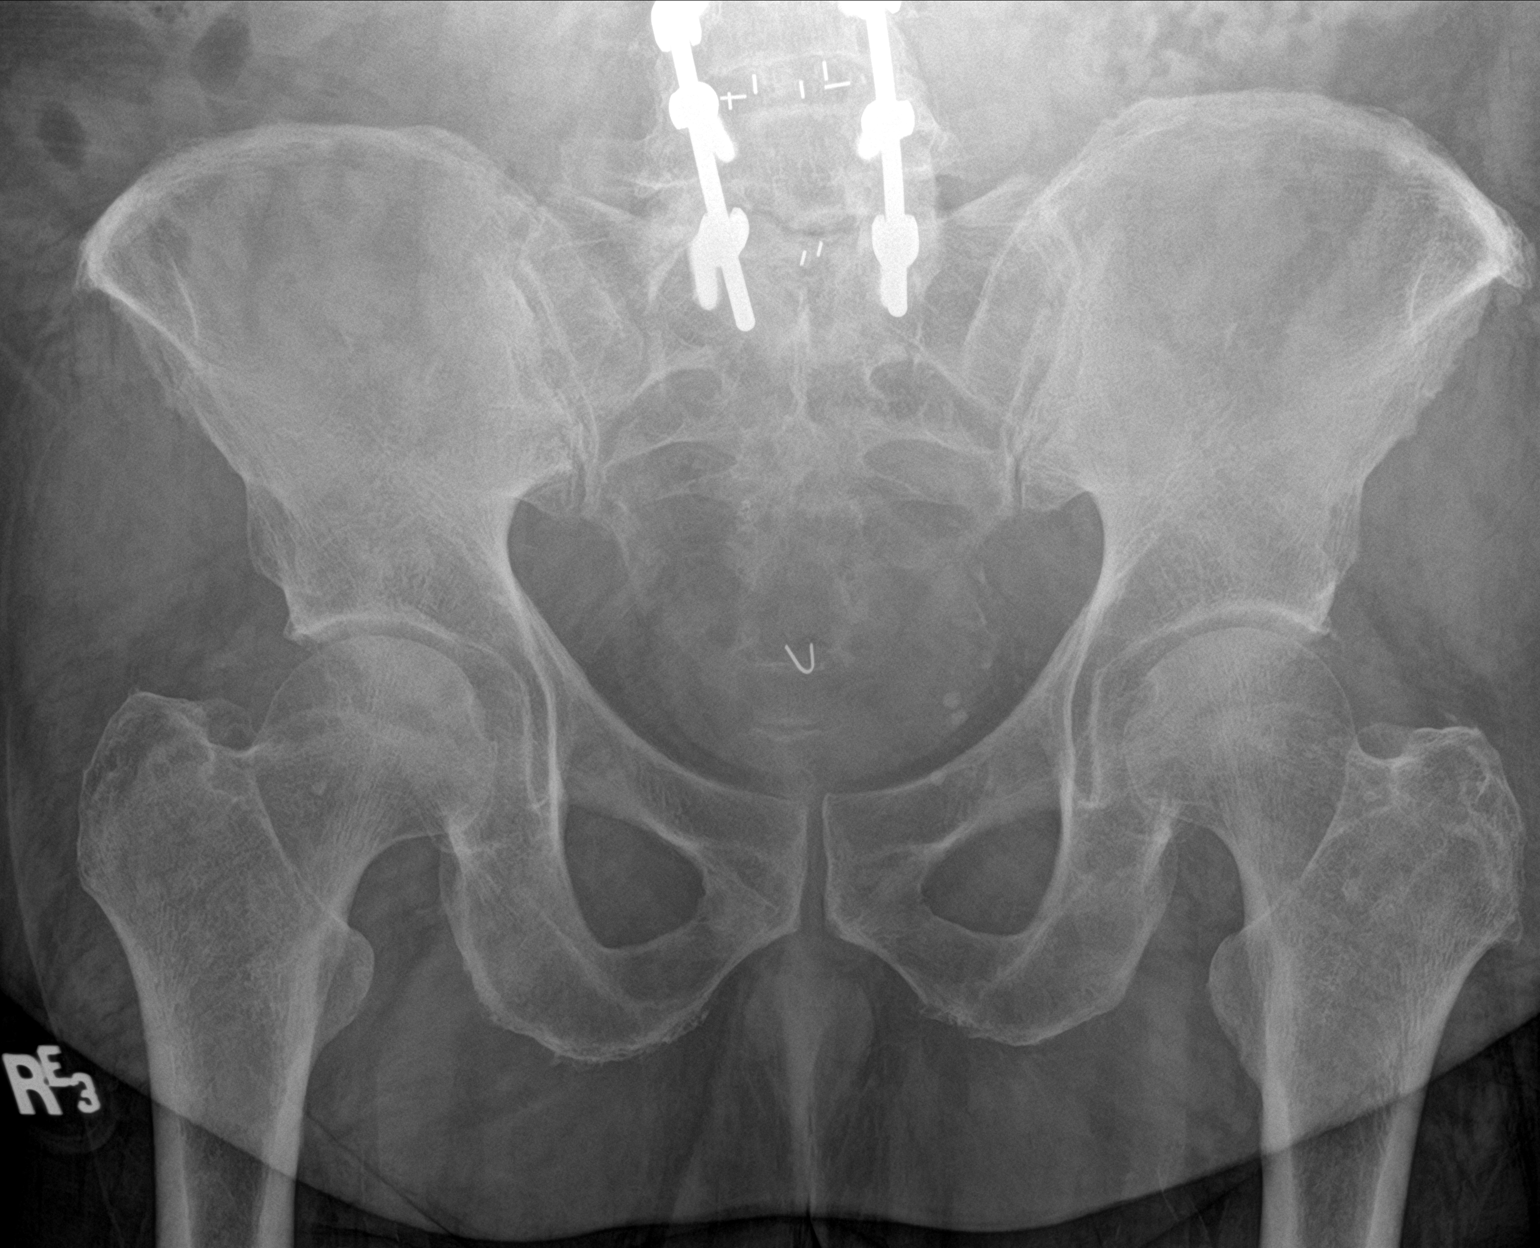

[hip ap]
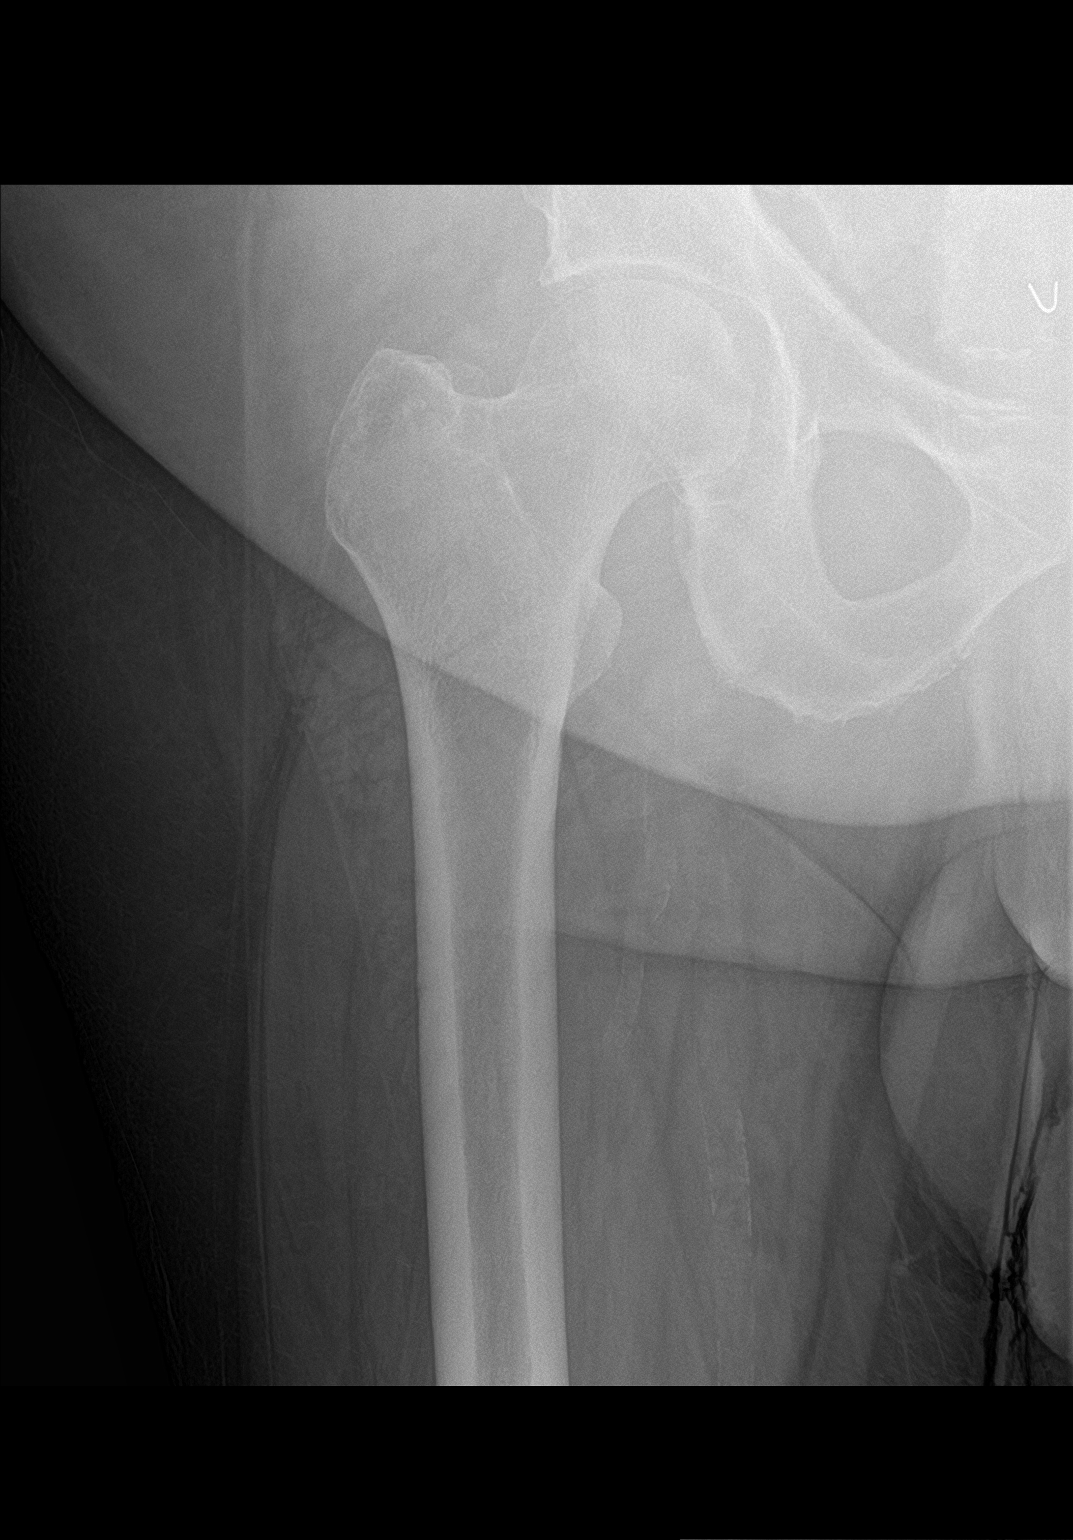

[hip lat]
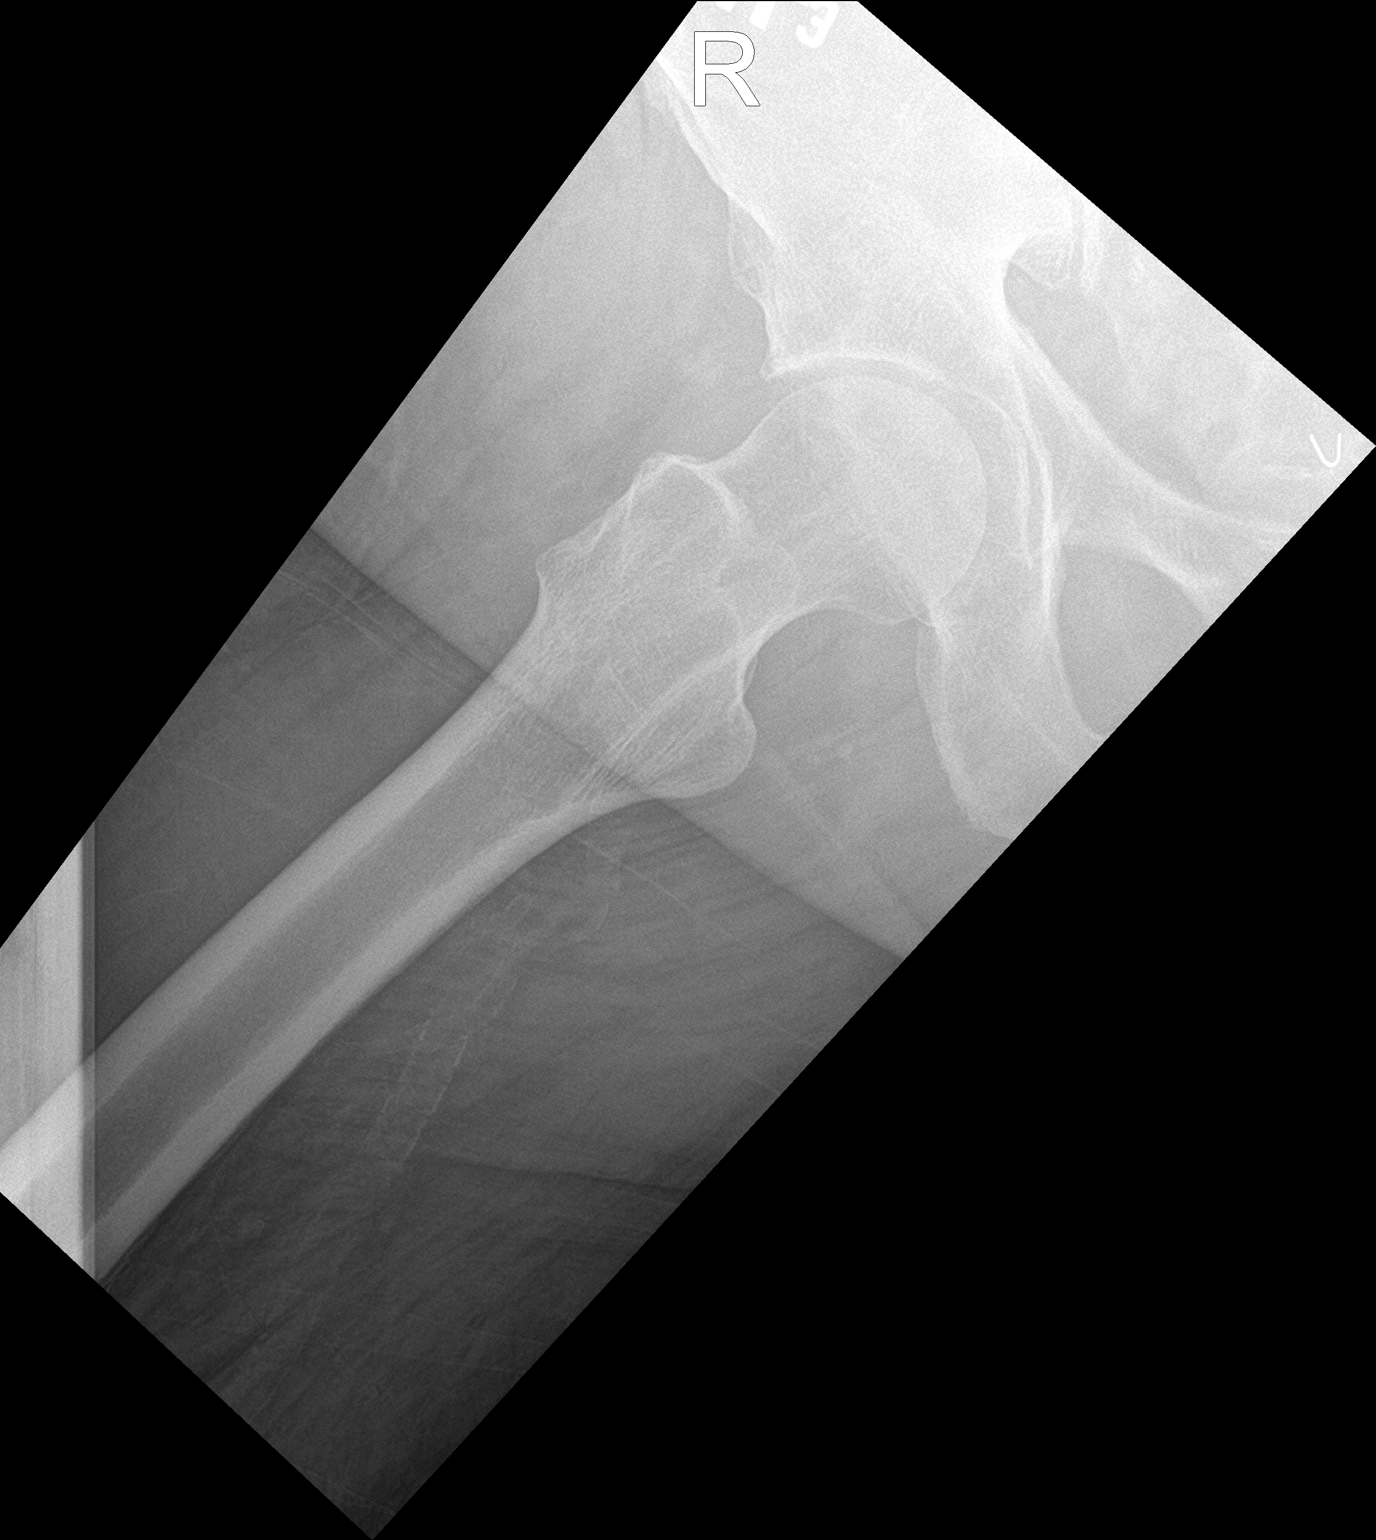

[3 of 3 positions shown; findings below may reference images not displayed]

FINDINGS: Orthopedic hardware in the lumbosacral spine. SI joints are non
widened. Pubic symphysis and rami are intact. U shaped metallic clip
in the pelvis. No fracture or malalignment.
IMPRESSION: Negative.

## 2023-05-28 ENCOUNTER — Encounter: Payer: Self-pay | Admitting: Podiatry

## 2023-05-28 ENCOUNTER — Ambulatory Visit

## 2023-05-28 ENCOUNTER — Other Ambulatory Visit: Payer: Self-pay

## 2023-05-28 ENCOUNTER — Ambulatory Visit (INDEPENDENT_AMBULATORY_CARE_PROVIDER_SITE_OTHER): Admitting: Podiatry

## 2023-05-28 DIAGNOSIS — Z4689 Encounter for fitting and adjustment of other specified devices: Secondary | ICD-10-CM | POA: Diagnosis not present

## 2023-05-28 DIAGNOSIS — E1142 Type 2 diabetes mellitus with diabetic polyneuropathy: Secondary | ICD-10-CM | POA: Diagnosis not present

## 2023-05-28 DIAGNOSIS — M21612 Bunion of left foot: Secondary | ICD-10-CM | POA: Diagnosis not present

## 2023-05-28 NOTE — Progress Notes (Signed)
  Subjective:  Patient ID: Rodney Murray, male    DOB: 12/02/1940,   MRN: [id number]  No chief complaint on file.   83 y.o. male presents for diabetic foot check and interested in orthotics and diabetic shoes Relates burning and tingling in their feet. Patient is diabetic and last A1c was No results found for: "HGBA1C" .   PCP:  Patient, No Pcp Per    . Denies any other pedal complaints. Denies n/v/f/c.   Past Medical History:  Diagnosis Date   A-fib (HCC)    Abnormal serum cholesterol    Diabetes mellitus without complication (HCC)    History of cardiac radiofrequency ablation    Hypertension    Thyroid disease     Objective:  Physical Exam: Vascular: DP/PT pulses 2/4 bilateral. CFT <3 seconds. Absent hair growth on digits. Edema noted to bilateral lower extremities. Xerosis noted bilaterally.  Skin. No lacerations or abrasions bilateral feet. Nails 1-5 bilateral  are normal in appearance Musculoskeletal: MMT 5/5 bilateral lower extremities in DF, PF, Inversion and Eversion. Deceased ROM in DF of ankle joint. 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 Protective sensation diminished bilateral.    Assessment:   1. Type 2 diabetes mellitus with peripheral neuropathy (HCC)   2. Bunion, left      Plan:  Patient was evaluated and treated and all questions answered. -Discussed and educated patient on diabetic foot care, especially with  regards to the vascular, neurological and musculoskeletal systems.  -Stressed the importance of good glycemic control and the detriment of not  controlling glucose levels in relation to the foot. -Discussed supportive shoes at all times and checking feet regularly.  -DM shoes ordered -Answered all patient questions -Patient to return  in 1 year for DM foot check  -Patient advised to call the office if any problems or questions arise in the meantime.   Louann Sjogren, DPM

## 2023-07-27 ENCOUNTER — Other Ambulatory Visit

## 2023-08-11 ENCOUNTER — Other Ambulatory Visit

## 2023-12-07 ENCOUNTER — Encounter: Payer: Self-pay | Admitting: Lab

## 2024-06-01 ENCOUNTER — Ambulatory Visit: Admitting: Podiatry
